# Patient Record
Sex: Male | Born: 1990 | Race: Black or African American | Hispanic: No | Marital: Single | Smoking: Never smoker
Health system: Southern US, Community
[De-identification: ages and names within clinical notes are randomized; demographics above are authoritative.]

## PROBLEM LIST (undated history)

## (undated) DIAGNOSIS — H01009 Unspecified blepharitis unspecified eye, unspecified eyelid: Secondary | ICD-10-CM

---
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
## 2011-07-30 LAB — LIPID PANEL: HDL: 39 mg/dL (ref 35–70)

## 2011-08-29 LAB — LIPID PANEL: LDL Cholesterol: 90 mg/dL

## 2011-09-16 ENCOUNTER — Encounter: Payer: Self-pay | Admitting: Family Medicine

## 2011-09-16 ENCOUNTER — Ambulatory Visit (INDEPENDENT_AMBULATORY_CARE_PROVIDER_SITE_OTHER): Payer: PRIVATE HEALTH INSURANCE | Admitting: Family Medicine

## 2011-09-16 VITALS — BP 133/89 | HR 68 | Temp 98.0°F | Ht 67.5 in | Wt 194.5 lb

## 2011-09-16 DIAGNOSIS — Z23 Encounter for immunization: Secondary | ICD-10-CM

## 2011-09-16 NOTE — Patient Instructions (Signed)
Your main health goal is to do more aerobic exercise - running, jogging, biking - something that gets your breathing and heart rate up for at least 30 minutes 5 days a week  This would lower your blood pressure and your weight and it would raise your HDL cholesterol - all good things for your health  Check your blood pressure regularly it should be < 130/85

## 2011-09-16 NOTE — Progress Notes (Signed)
  Subjective:    Patient ID: Jerry Ortega, male    DOB: Feb 17, 1991, 20 y.o.   MRN: [id number]  HPI  For CPE feels well    Review of Systems Patient reports no  vision/ hearing changes,anorexia, weight change, fever ,adenopathy, persistant / recurrent hoarseness, swallowing issues, chest pain, edema,persistant / recurrent cough, hemoptysis, dyspnea(rest, exertional, paroxysmal nocturnal), gastrointestinal  bleeding (melena, rectal bleeding), abdominal pain, excessive heart burn, GU symptoms(dysuria, hematuria, pyuria, voiding/incontinence  Issues) syncope, focal weakness, severe memory loss, concerning skin lesions, depression, anxiety, abnormal bruising/bleeding, major joint swelling.       Objective:   Physical Exam  Heart - Regular rate and rhythm.  No murmurs, gallops or rubs.    Lungs:  Normal respiratory effort, chest expands symmetrically. Lungs are clear to auscultation, no crackles or wheezes. Extremities:  No cyanosis, edema, or deformity noted with good range of motion of all major joints.   Abdomen: soft and non-tender without masses, organomegaly or hernias noted.  No guarding or rebound Skin:  Intact without suspicious lesions or rashes Mouth - no lesions, mucous membranes are moist, no decaying teeth  Neck:  No deformities, thyromegaly, masses, or tenderness noted.   Supple with full range of motion without pain.       Assessment & Plan:   Normal Physical See AVS for instructions

## 2012-07-14 LAB — LIPID PANEL
HDL: 40 mg/dL (ref 35–70)
LDL Cholesterol: 104 mg/dL

## 2012-08-24 ENCOUNTER — Ambulatory Visit (INDEPENDENT_AMBULATORY_CARE_PROVIDER_SITE_OTHER): Payer: PRIVATE HEALTH INSURANCE | Admitting: Family Medicine

## 2012-08-24 ENCOUNTER — Encounter: Payer: Self-pay | Admitting: Family Medicine

## 2012-08-24 VITALS — BP 120/80 | HR 99 | Temp 98.2°F | Ht 67.0 in | Wt 183.0 lb

## 2012-08-24 DIAGNOSIS — F411 Generalized anxiety disorder: Secondary | ICD-10-CM

## 2012-08-24 DIAGNOSIS — F419 Anxiety disorder, unspecified: Secondary | ICD-10-CM

## 2012-08-24 DIAGNOSIS — Z23 Encounter for immunization: Secondary | ICD-10-CM

## 2012-08-24 MED ORDER — SERTRALINE HCL 25 MG PO TABS: 25.0000 mg | ORAL_TABLET | Freq: Every day | ORAL | Status: DC

## 2012-08-24 NOTE — Progress Notes (Signed)
  Subjective:    Patient ID: Peter Minium, male    DOB: 02-20-1991, 21 y.o.   MRN: [id number]  HPI Feels well except for   Anxiety Has feelings of anxiousness in work and public and social situations.  Also chronic insomnia with erratic sleep patterns.  Reports a history of depression in past with "almost" suicidal ideation but overcame this with will power.  No depression or suicidal ideation now.  Has good appetite and enjoys work but does not seem to do much socially.  PMH - briefly treated for ADHD   FHx - Mom has anxiety well treated with sertraline.      Review of Systems     Objective:   Physical Exam Heart - Regular rate and rhythm.  No murmurs, gallops or rubs.    Lungs:  Normal respiratory effort, chest expands symmetrically. Lungs are clear to auscultation, no crackles or wheezes. Extremities:  No cyanosis, edema, or deformity noted with good range of motion of all major joints.   Neck:  No deformities, thyromegaly, masses, or tenderness noted.   Supple with full range of motion without pain.        Assessment & Plan:

## 2012-08-24 NOTE — Assessment & Plan Note (Signed)
Tentative diagnosis.  Does not seem to have manic symptoms and does have strong family history of anxiety.  He is reluctant to discuss feelings and feels that he can overcome issues through force of will but also would like to feel better.  Feels he can talk with people at work and does not want to see counselor (I mentioned EAP)  Will start low dose sertraline and follow up closely

## 2012-08-24 NOTE — Patient Instructions (Addendum)
Start the sertraline in the AM 1 tablet for 4-5 days then increase to 2 tabs every AM.   If it makes you tired the take it in the PM.  Come back in 2-3 weeks to see how it is working  Call if me if any problems or questions

## 2012-08-30 ENCOUNTER — Encounter: Payer: Self-pay | Admitting: Family Medicine

## 2012-08-30 ENCOUNTER — Telehealth: Payer: Self-pay | Admitting: Family Medicine

## 2012-08-30 NOTE — Telephone Encounter (Deleted)
Please call him and let him know he left his Work Form.  I filled it out and it is available for pick up at the front desk  Thanks  LC

## 2012-08-30 NOTE — Telephone Encounter (Signed)
Error

## 2012-09-12 ENCOUNTER — Ambulatory Visit (INDEPENDENT_AMBULATORY_CARE_PROVIDER_SITE_OTHER): Payer: PRIVATE HEALTH INSURANCE | Admitting: Family Medicine

## 2012-09-12 ENCOUNTER — Encounter: Payer: Self-pay | Admitting: Family Medicine

## 2012-09-12 VITALS — BP 123/73 | HR 82 | Temp 98.6°F | Ht 67.0 in | Wt 183.0 lb

## 2012-09-12 DIAGNOSIS — F419 Anxiety disorder, unspecified: Secondary | ICD-10-CM

## 2012-09-12 DIAGNOSIS — F411 Generalized anxiety disorder: Secondary | ICD-10-CM

## 2012-09-12 MED ORDER — SERTRALINE HCL 25 MG PO TABS: 50.0000 mg | ORAL_TABLET | Freq: Every day | ORAL | Status: DC

## 2012-09-12 MED ORDER — SERTRALINE HCL 50 MG PO TABS: 50.0000 mg | ORAL_TABLET | Freq: Every day | ORAL | Status: DC

## 2012-09-12 NOTE — Assessment & Plan Note (Signed)
Improving.  Discussed the way the medication works and that it will unlikely cure his symptoms.  He would like to take if for some months and then likely go off to see if still needed.

## 2012-09-12 NOTE — Patient Instructions (Addendum)
Finish taking the 2 25mg  tabs then start taking one 50 mg tab  If any problems or side effects contact us  Take if for 2-6 months then you could try going off of it to see if still needed

## 2012-09-12 NOTE — Progress Notes (Signed)
  Subjective:    Patient ID: Jerry Ortega, male    DOB: [birth year], 21 y.o.   MRN: [id number]  HPI  Anxiety Feels like the sertraline is helping him sleep better (less worrying) feel less anxious at work.  Taking 50 mg daily.  No side effects that he has noticed.   No insomnia or extra energy or rashes.  No depressive symptoms     Review of Systems     Objective:   Physical Exam  Alert no acute distress  Psych:  Cognition and judgment appear intact. Alert, communicative  and cooperative with normal attention span and concentration. No apparent delusions, illusions, hallucinations       Assessment & Plan:

## 2013-07-26 ENCOUNTER — Encounter: Payer: Self-pay | Admitting: Family Medicine

## 2013-07-26 ENCOUNTER — Ambulatory Visit (INDEPENDENT_AMBULATORY_CARE_PROVIDER_SITE_OTHER): Payer: PRIVATE HEALTH INSURANCE | Admitting: Family Medicine

## 2013-07-26 VITALS — BP 120/84 | HR 72 | Temp 97.8°F | Ht 67.0 in | Wt 186.0 lb

## 2013-07-26 DIAGNOSIS — F411 Generalized anxiety disorder: Secondary | ICD-10-CM

## 2013-07-26 DIAGNOSIS — K5900 Constipation, unspecified: Secondary | ICD-10-CM

## 2013-07-26 DIAGNOSIS — F419 Anxiety disorder, unspecified: Secondary | ICD-10-CM

## 2013-07-26 NOTE — Progress Notes (Signed)
  Subjective:    Patient ID: Jerry Ortega, male    DOB: 1991-06-22, 22 y.o.   MRN: [id number]  HPI  Anxiety - stopped sertraline 6 mo ago.  Made him feel tired.  Feels ok with out it.   Has mild mood changes - when stressed he will listen to music.  No weight loss or anhedonia.  Agrees to contact me if this changes  Constipation - for last year of so having hard bowel movement that he has to strain.  Occasional small amounts of BRB.  No abdomen pain or weight loss.  Does not eat any greens or veggies other than bananas.     Patient reports no  vision/ hearing changes,anorexia, weight change, fever ,adenopathy, persistant / recurrent hoarseness, swallowing issues, chest pain, edema,persistant / recurrent cough, hemoptysis, dyspnea(rest, exertional, paroxysmal nocturnal),  abdominal pain, excessive heart burn, GU symptoms(dysuria, hematuria, pyuria, voiding/incontinence  Issues) syncope, focal weakness, severe memory loss, concerning skin lesions, abnormal bruising/bleeding, major joint swelling.    PMH - Smoking status noted.      Review of Systems     Objective:   Physical Exam  Alert no acute distress Neck:  No deformities, thyromegaly, masses, or tenderness noted.   Supple with full range of motion without pain. Heart - Regular rate and rhythm.  No murmurs, gallops or rubs.    Lungs:  Normal respiratory effort, chest expands symmetrically. Lungs are clear to auscultation, no crackles or wheezes. Abdomen: soft and non-tender without masses, organomegaly or hernias noted.  No guarding or rebound Extremities:  No cyanosis, edema, or deformity noted with good range of motion of all major joints.         Assessment & Plan:

## 2013-07-26 NOTE — Assessment & Plan Note (Signed)
Seems stable without medicatons.  He agrees to contact me if any worsening

## 2013-07-26 NOTE — Assessment & Plan Note (Signed)
Seems related to low fiber diet.  No signs of systemic disease.  Trial of fiber and recheck

## 2013-07-26 NOTE — Patient Instructions (Signed)
For your stool - eat 2-3 servings of roughage a day - salad, vegetables or fruit with skin on it.   If you can't then take a fiber supplement every day.  Metamucil or Fibercon   If your bowel movement are not regular and you have no bleeding then come back in 3 weeks  Your triglyrides are ok - Make sure you are fasting for 8 hours next time they take blood  If you mood or anxiety worsens then come back to discuss options  Come back in 3 weeks

## 2013-08-09 ENCOUNTER — Encounter: Payer: Self-pay | Admitting: Family Medicine

## 2013-08-09 ENCOUNTER — Ambulatory Visit (INDEPENDENT_AMBULATORY_CARE_PROVIDER_SITE_OTHER): Payer: PRIVATE HEALTH INSURANCE | Admitting: Family Medicine

## 2013-08-09 VITALS — BP 140/88 | HR 94 | Temp 98.4°F | Wt 184.0 lb

## 2013-08-09 DIAGNOSIS — K5900 Constipation, unspecified: Secondary | ICD-10-CM

## 2013-08-09 DIAGNOSIS — Z23 Encounter for immunization: Secondary | ICD-10-CM

## 2013-08-09 NOTE — Progress Notes (Signed)
  Subjective:    Patient ID: Jerry Ortega, male    DOB: 03-Mar-1991, 22 y.o.   MRN: [id number]  HPI  Constipation - feels is improved with regular use of metamucil tabs.  Not eating any more veggies.  No bleeding.  Sometimes stools are dry and sometimes moist.  No abdomen pain. No weight loss.  Review of Symptoms - see HPI  PMH - Smoking status noted.     Review of Systems     Objective:   Physical Exam  No acute distress       Assessment & Plan:

## 2013-08-09 NOTE — Patient Instructions (Signed)
Adjust the amount of metamucil until your bowel movement are regular  Try to eat at least 2 servings of green vegetables or roughage - apples/ potatoe skins  If your bowel movements are to dry then drink 2-3 glasses more of water a day and/or add a stool softner -   If you are losing weight or having blood in your bowel movement you need to come back

## 2013-08-10 NOTE — Assessment & Plan Note (Signed)
Improved with more fiber.  No red flags for inflammatory bowel disease.  Encouraged more veggies.

## 2014-07-30 ENCOUNTER — Ambulatory Visit (INDEPENDENT_AMBULATORY_CARE_PROVIDER_SITE_OTHER): Payer: PRIVATE HEALTH INSURANCE | Admitting: Family Medicine

## 2014-07-30 ENCOUNTER — Encounter: Payer: Self-pay | Admitting: Family Medicine

## 2014-07-30 ENCOUNTER — Ambulatory Visit (INDEPENDENT_AMBULATORY_CARE_PROVIDER_SITE_OTHER): Payer: PRIVATE HEALTH INSURANCE | Admitting: *Deleted

## 2014-07-30 VITALS — BP 122/75 | HR 80 | Temp 98.2°F | Resp 18 | Wt 168.0 lb

## 2014-07-30 DIAGNOSIS — Z0000 Encounter for general adult medical examination without abnormal findings: Secondary | ICD-10-CM

## 2014-07-30 DIAGNOSIS — Z23 Encounter for immunization: Secondary | ICD-10-CM

## 2014-07-30 NOTE — Patient Instructions (Signed)
Good to see you today!  Thanks for coming in.  You are at a good weight.  If you lose more than 5 more lbs you should come back for blood test  You should exercise mostly aerobic 3 x a week 20-30 minutes  Listen yourself.   If you would like to discuss this further come back

## 2014-07-30 NOTE — Progress Notes (Signed)
   Subjective:    Patient ID: Jerry MiniumAndrew A Harbeson, male    DOB: 1990-11-14, 23 y.o.   MRN: [id number]  HPI  For yearly exam mandated by work  He feels he is generally well.   He has lost about 20 lbs that was semi-intentional.  He realized he was overweight. He feels frustrated by lack of promotion at his job and is held back that he has never gotten a Gafferdrivers license.   He has mild difficulty sleeping due to his 12 hour shifts and less than regular schedule when not working.  He hope this will motivate him.  Is not currently interested in medication or counseling to help with these feelings  Intermittent constipation thatt is much better taking fiber and probiotics.  Occasional mild rectal bleeding.  No pain or itching or diarrhea or abdomen pain   Occasional beer and marijauna.    Patient reports no  vision/ hearing changes,anorexia,  fever ,adenopathy, persistant / recurrent hoarseness, swallowing issues, chest pain, edema,persistant / recurrent cough, hemoptysis, dyspnea(rest, exertional, paroxysmal nocturnal), , abdominal pain, excessive heart burn, GU symptoms(dysuria, hematuria, pyuria, voiding/incontinence  Issues) syncope, focal weakness, severe memory loss, concerning skin lesions, depression, anxiety, abnormal bruising/bleeding, major joint swelling.    Review of Systems     Objective:   Physical Exam  Alert no acute distress Psych:  Cognition and judgment appear intact. Alert, communicative  and cooperative with normal attention span and concentration. No apparent delusions, illusions, hallucinations Ears:  External ear exam shows no significant lesions or deformities.  Otoscopic examination reveals clear canals, tympanic membranes are intact bilaterally without bulging, retraction, inflammation or discharge. Hearing is grossly normal bilaterall Neck:  No deformities, thyromegaly, masses, or tenderness noted.   Supple with full range of motion without pain. Heart - Regular rate  and rhythm.  No murmurs, gallops or rubs.    Lungs:  Normal respiratory effort, chest expands symmetrically. Lungs are clear to auscultation, no crackles or wheezes. Abdomen: soft and non-tender without masses, organomegaly or hernias noted.  No guarding or rebound Extremities:  No cyanosis, edema, or deformity noted with good range of motion of all major joints.         Assessment & Plan:    Weight loss - at an appropriate weight.  No red flags for severe depression or endocrine disorder.  Will monitor  Mental Health - might benefit from counseling which I offered.  He will consider.  Not interested in medications.

## 2019-10-10 ENCOUNTER — Ambulatory Visit: Payer: PRIVATE HEALTH INSURANCE | Attending: Internal Medicine

## 2019-10-10 ENCOUNTER — Other Ambulatory Visit: Payer: PRIVATE HEALTH INSURANCE

## 2019-10-10 ENCOUNTER — Other Ambulatory Visit: Payer: Self-pay

## 2019-10-10 DIAGNOSIS — Z20822 Contact with and (suspected) exposure to covid-19: Secondary | ICD-10-CM

## 2019-10-12 LAB — NOVEL CORONAVIRUS, NAA: SARS-CoV-2, NAA: NOT DETECTED

## 2020-05-02 ENCOUNTER — Ambulatory Visit
Admission: EM | Admit: 2020-05-02 | Discharge: 2020-05-02 | Disposition: A | Discharge status: Home / Self Care | Payer: 59 | Attending: Emergency Medicine | Admitting: Emergency Medicine

## 2020-05-02 ENCOUNTER — Other Ambulatory Visit: Payer: Self-pay

## 2020-05-02 NOTE — ED Triage Notes (Signed)
Pt here for suture removal, 4 stiches from left nostril removed , skin approximated well

## 2020-08-14 ENCOUNTER — Ambulatory Visit (INDEPENDENT_AMBULATORY_CARE_PROVIDER_SITE_OTHER): Payer: 59

## 2020-08-14 ENCOUNTER — Ambulatory Visit
Admission: EM | Admit: 2020-08-14 | Discharge: 2020-08-14 | Disposition: A | Discharge status: Home / Self Care | Payer: 59 | Attending: Emergency Medicine | Admitting: Emergency Medicine

## 2020-08-14 ENCOUNTER — Encounter: Payer: Self-pay | Admitting: Emergency Medicine

## 2020-08-14 ENCOUNTER — Other Ambulatory Visit: Payer: Self-pay

## 2020-08-14 DIAGNOSIS — W19XXXA Unspecified fall, initial encounter: Secondary | ICD-10-CM

## 2020-08-14 DIAGNOSIS — S99921A Unspecified injury of right foot, initial encounter: Secondary | ICD-10-CM | POA: Diagnosis not present

## 2020-08-14 DIAGNOSIS — M79671 Pain in right foot: Secondary | ICD-10-CM

## 2020-08-14 MED ORDER — MELOXICAM 15 MG PO TABS: 15.0000 mg | ORAL_TABLET | Freq: Every day | ORAL | 0 refills | Status: DC

## 2020-08-14 NOTE — ED Provider Notes (Signed)
[facility]   [id number] 08/14/20 Arrival Time: 1701  CC: foot PAIN  SUBJECTIVE: History from: patient. Jerry Ortega is a 29 y.o. male complains of RT foot pain and injury that occurred this morning.  Fall from 5 feet when attic stairs broke.  Localizes the pain to the top of foot.  Describes the pain as intermittent and achy in character.  Has NOT tried OTC medications.  Symptoms are made worse with walking.  Denies similar symptoms in the past.  Complains of swelling.  Denies fever, chills, erythema, ecchymosis, weakness, numbness and tingling.  ROS: As per HPI.  All other pertinent ROS negative.     History reviewed. No pertinent past medical history. History reviewed. No pertinent surgical history. No Known Allergies No current facility-administered medications on file prior to encounter.   No current outpatient medications on file prior to encounter.   Social History   Socioeconomic History  . Marital status: Single    Spouse name: Not on file  . Number of children: Not on file  . Years of education: Not on file  . Highest education level: Not on file  Occupational History  . Not on file  Tobacco Use  . Smoking status: Never Smoker  . Smokeless tobacco: Never Used  Substance and Sexual Activity  . Alcohol use: Yes    Alcohol/week: 1.0 standard drink    Types: 1 drink(s) per week  . Drug use: No  . Sexual activity: Not on file  Other Topics Concern  . 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 Difficulty of Paying Living Expenses: Not on file  Food Insecurity:   . Worried About Programme researcher, broadcasting/film/video in the Last Year: Not on file  . Ran Out of Food in the Last Year: Not on file  Transportation Needs:   . Lack of Transportation (Medical): Not on file  . Lack  of Transportation (Non-Medical): Not on file  Physical Activity:   . Days of Exercise per Week: Not on file  . Minutes of Exercise per Session: Not on file  Stress:   . Feeling of Stress : Not on file  Social Connections:   . Frequency of Communication with Friends and Family: Not on file  . Frequency of Social Gatherings with Friends and Family: Not on file  . Attends Religious Services: Not on file  . Active Member of Clubs or Organizations: Not on file  . Attends Banker Meetings: Not on file  . Marital Status: Not on file  Intimate Partner Violence:   . Fear of Current or Ex-Partner: Not on file  . Emotionally Abused: Not on file  . Physically Abused: Not on file  . Sexually Abused: Not on file   Family History  Problem Relation Age of Onset  . Hypertension Father   . Diabetes Paternal Grandmother     OBJECTIVE:  Vitals:   08/14/20 1713 08/14/20 1714  BP:  123/73  Pulse:  84  Resp:  18  Temp:  98.7 F (37.1 C)  TempSrc:  Oral  SpO2:  96%  Weight: 175 lb (79.4 kg)   Height: 5\' 7"  (1.702 m)     General appearance: ALERT; in no acute distress.  Head: NCAT Lungs: Normal respiratory effort CV: Dorsalis pedis pulse  2+ Musculoskeletal: RT foot Inspection: Mild swelling over lateral  Palpation: diffusely TTP over 2-5 distal MTs Strength: deferred Skin: warm and dry Neurologic: Ambulates with minimal difficulty Psychological: alert and cooperative; normal mood and affect  DIAGNOSTIC STUDIES:  DG Foot Complete Right  Result Date: 08/14/2020 CLINICAL DATA:  Fall, right foot pain EXAM: RIGHT FOOT COMPLETE - 3+ VIEW COMPARISON:  None. FINDINGS: There is no evidence of fracture or dislocation. There is no evidence of arthropathy or other focal bone abnormality. Soft tissues are unremarkable. IMPRESSION: Negative. Electronically Signed   By: Charlett Nose M.D.   On: 08/14/2020 17:47    X-rays negative for bony abnormalities including fracture, or  dislocation.  No soft tissue swelling.    I have reviewed the x-rays myself and the radiologist interpretation. I am in agreement with the radiologist interpretation.     ASSESSMENT & PLAN:  1. Right foot pain   2. Injury of right foot, initial encounter     @NIMG @  Meds ordered this encounter  Medications  . meloxicam (MOBIC) 15 MG tablet    Sig: Take 1 tablet (15 mg total) by mouth daily.    Dispense:  30 tablet    Refill:  0    Order Specific Question:   Supervising Provider    Answer:   Eustace Moore   X-ray negative for fracture or dislocation.  Continue conservative management of rest, ice, and elevation Take mobic as needed for pain relief (may cause abdominal discomfort, ulcers, and GI bleeds avoid taking with other NSAIDs) Follow up with PCP if symptoms persist Return or go to the ER if you have any new or worsening symptoms (fever, chills, chest pain, redness, swelling, bruising, deformity, etc...)   Reviewed expectations re: course of current medical issues. Questions answered. Outlined signs and symptoms indicating need for more acute intervention. Patient verbalized understanding. After Visit Summary given.    [[id number]], PA-C 08/14/20 1752

## 2020-08-14 NOTE — ED Triage Notes (Signed)
Pt fell 5 feet when attic stairs broke today around 1000. C/o pain to top of RT foot.  Pt ambulatory with no difficulty to room from waiting room.

## 2020-08-14 NOTE — Discharge Instructions (Signed)
X-ray negative for fracture or dislocation.  Continue conservative management of rest, ice, and elevation Take mobic as needed for pain relief (may cause abdominal discomfort, ulcers, and GI bleeds avoid taking with other NSAIDs) Follow up with PCP if symptoms persist Return or go to the ER if you have any new or worsening symptoms (fever, chills, chest pain, redness, swelling, bruising, deformity, etc...)

## 2021-04-08 IMAGING — DX DG FOOT COMPLETE 3+V*R*
3 series · 3 of 3 positions shown · non-contrast
Comparison: None.

CLINICAL DATA: Fall, right foot pain

EXAM:
RIGHT FOOT COMPLETE - 3+ VIEW

[foot ap]
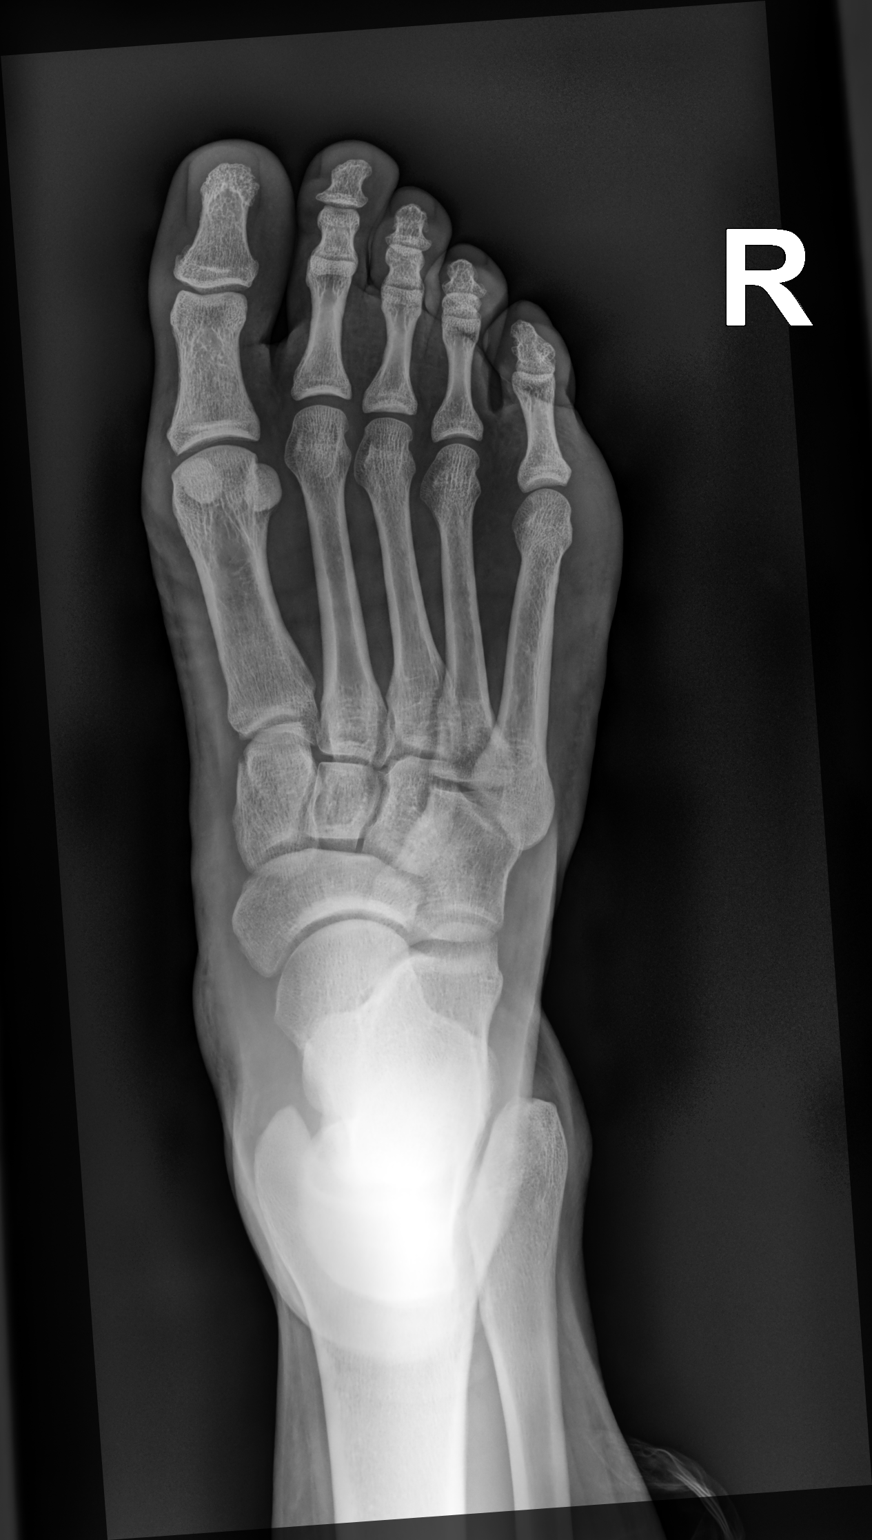

[foot mlo]
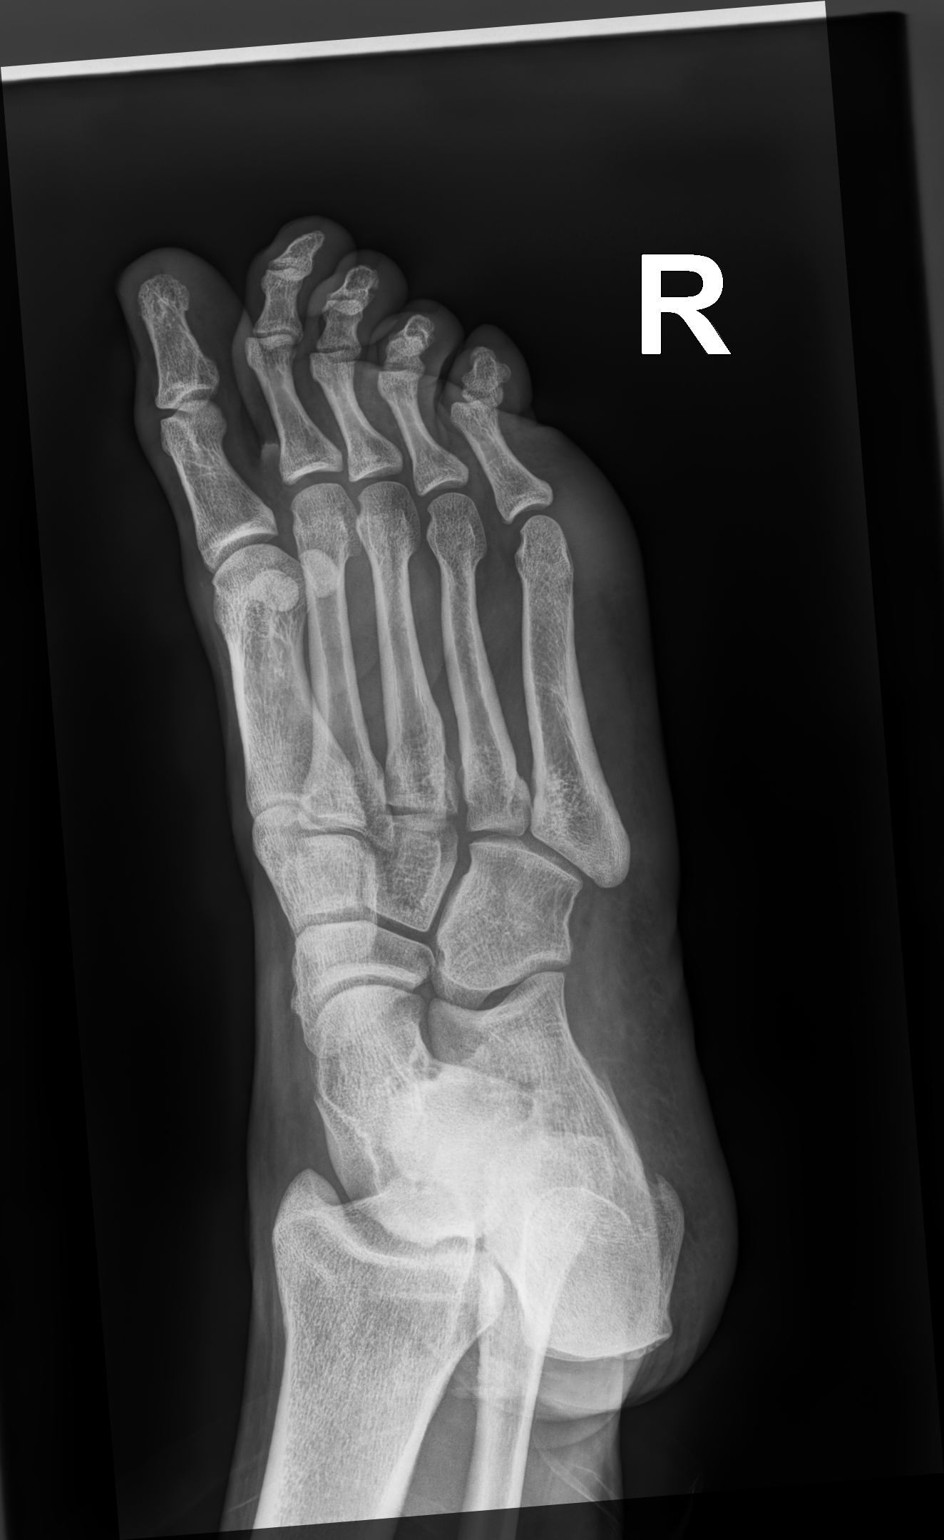

[foot lat]
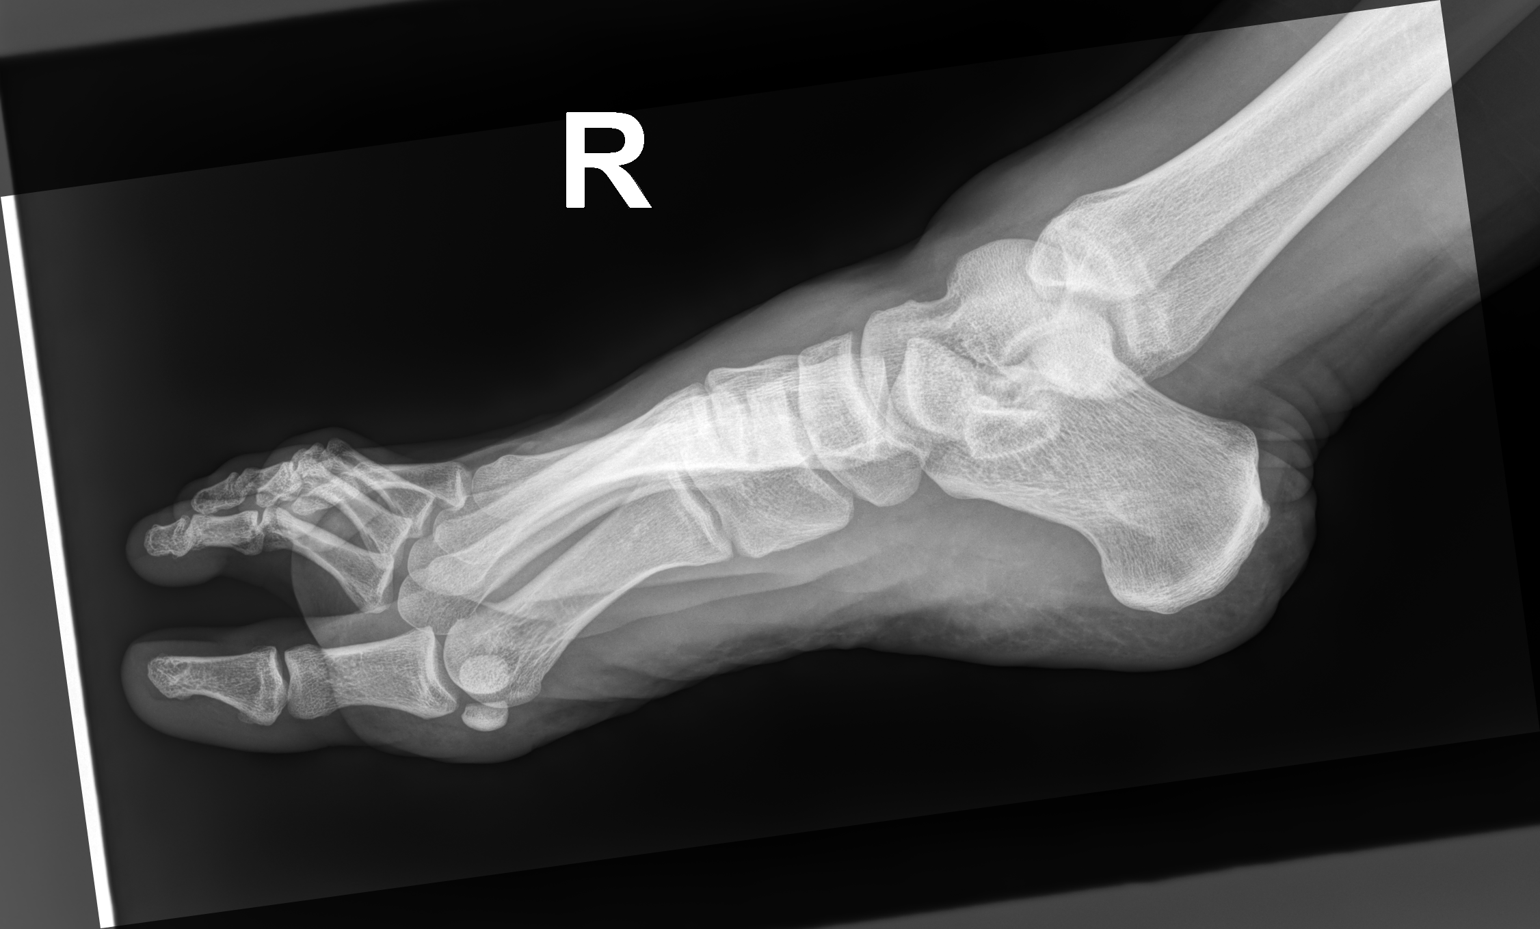

[3 of 3 positions shown; findings below may reference images not displayed]

FINDINGS: There is no evidence of fracture or dislocation. There is no
evidence of arthropathy or other focal bone abnormality. Soft
tissues are unremarkable.
IMPRESSION: Negative.

## 2022-12-16 ENCOUNTER — Encounter: Payer: Self-pay | Admitting: Emergency Medicine

## 2022-12-16 ENCOUNTER — Ambulatory Visit: Payer: Self-pay

## 2022-12-16 ENCOUNTER — Ambulatory Visit
Admission: EM | Admit: 2022-12-16 | Discharge: 2022-12-16 | Disposition: A | Discharge status: Home / Self Care | Payer: Commercial Managed Care - PPO

## 2022-12-16 ENCOUNTER — Other Ambulatory Visit: Payer: Self-pay

## 2022-12-16 DIAGNOSIS — J029 Acute pharyngitis, unspecified: Secondary | ICD-10-CM

## 2022-12-16 DIAGNOSIS — Z20818 Contact with and (suspected) exposure to other bacterial communicable diseases: Secondary | ICD-10-CM

## 2022-12-16 HISTORY — DX: Unspecified blepharitis unspecified eye, unspecified eyelid: H01.009

## 2022-12-16 LAB — POCT RAPID STREP A (OFFICE): Rapid Strep A Screen: NEGATIVE

## 2022-12-16 NOTE — ED Provider Notes (Signed)
[facility]    CSN: [id number] Arrival date & time: 12/16/22  1653      History   Chief Complaint Chief Complaint  Patient presents with   Sore Throat    HPI Jerry Ortega is a 32 y.o. male.   Patient presents for approximately 3 weeks of slightly sore throat, slightly runny and stuffy nose, diarrhea Sunday evening, and decreased appetite with fatigue.  No fever, bodyaches or chills, congested cough, dry cough, shortness of breath or chest pain, postnasal drainage, headache, ear pain, abdominal pain, nausea/vomiting, or decreased appetite.  Has not take anything for symptoms so far.  Reports his wife tested positive for strep throat today.  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    Past Medical History:  Diagnosis Date   Blepharitis     Patient Active Problem List   Diagnosis Date Noted   Unspecified constipation 07/26/2013   Anxiety 08/24/2012    History reviewed. No pertinent surgical history.     Home Medications    Prior to Admission medications   Medication Sig Start Date End Date Taking? Authorizing Provider  doxycycline (ADOXA) 100 MG tablet Take 100 mg by mouth daily.   Yes [provider]  meloxicam (MOBIC) 15 MG tablet Take 1 tablet (15 mg total) by mouth daily. 08/14/20   Lestine Box, PA-C    Family History Family History  Problem Relation Age of Onset   Hypertension Father    Diabetes Paternal Grandmother     Social History Social History   Tobacco Use   Smoking status: Never   Smokeless tobacco: Never  Substance Use Topics   Alcohol use: Not Currently    Alcohol/week: 1.0 standard drink of alcohol    Types: 1 drink(s) per week   Drug use: No     Allergies   Patient has no known allergies.   Review of Systems Review of Systems Per HPI  Physical Exam Triage Vital Signs ED Triage Vitals  Enc Vitals Group     BP 12/16/22 1715 135/82     Pulse Rate 12/16/22 1715 78      Resp 12/16/22 1715 20     Temp 12/16/22 1715 98.2 F (36.8 C)     Temp Source 12/16/22 1715 Oral     SpO2 12/16/22 1715 97 %     Weight --      Height --      Head Circumference --      Peak Flow --      Pain Score 12/16/22 1711 2     Pain Loc --      Pain Edu? --      Excl. in [place]? --    No data found.  Updated Vital Signs BP 135/82 (BP Location: Right Arm)   Pulse 78   Temp 98.2 F (36.8 C) (Oral)   Resp 20   SpO2 97%   Visual Acuity Right Eye Distance:   Left Eye Distance:   Bilateral Distance:    Right Eye Near:   Left Eye Near:    Bilateral Near:     Physical Exam Vitals and nursing note reviewed.  Constitutional:      General: He is not in acute distress.    Appearance: He is well-developed. He is not ill-appearing, toxic-appearing or diaphoretic.  HENT:     Head: Normocephalic and atraumatic.     Right Ear: Tympanic membrane and ear canal normal. No drainage, swelling or tenderness. No  middle ear effusion. Tympanic membrane is not erythematous.     Left Ear: Tympanic membrane and ear canal normal. No drainage, swelling or tenderness.  No middle ear effusion. Tympanic membrane is not erythematous.     Nose: No congestion or rhinorrhea.     Mouth/Throat:     Mouth: Mucous membranes are dry.     Pharynx: Posterior oropharyngeal erythema present. No uvula swelling.     Tonsils: No tonsillar exudate or tonsillar abscesses. 0 on the right. 0 on the left.  Eyes:     Conjunctiva/sclera: Conjunctivae normal.  Cardiovascular:     Rate and Rhythm: Normal rate and regular rhythm.  Pulmonary:     Effort: Pulmonary effort is normal. No respiratory distress.     Breath sounds: Normal breath sounds. No wheezing, rhonchi or rales.  Musculoskeletal:     Cervical back: Normal range of motion and neck supple.  Lymphadenopathy:     Cervical: No cervical adenopathy.  Skin:    General: Skin is warm and dry.     Coloration: Skin is not pale.     Findings: No erythema or  rash.  Neurological:     Mental Status: He is alert and oriented to person, place, and time.      UC Treatments / Results  Labs (all labs ordered are listed, but only abnormal results are displayed) Labs Reviewed  POCT RAPID STREP A (OFFICE)    EKG   Radiology No results found.  Procedures Procedures (including critical care time)  Medications Ordered in UC Medications - No data to display  Initial Impression / Assessment and Plan / UC Course  I have reviewed the triage vital signs and the nursing notes.  Pertinent labs & imaging results that were available during my care of the patient were reviewed by me and considered in my medical decision making (see chart for details).   Patient is well-appearing, normotensive, afebrile, not tachycardic, not tachypneic, oxygenating well on room air.   1. Acute pharyngitis, unspecified etiology 2. Exposure to strep throat Rapid strep test is negative Low suspicion for strep throat given examination, history, and vital signs Suspect allergic rhinitis, recommended starting oral antihistamine and nasal spray ER and return precautions discussed with patient Note given for  The patient was given the opportunity to ask questions.  All questions answered to their satisfaction.  The patient is in agreement to this plan.    Final Clinical Impressions(s) / UC Diagnoses   Final diagnoses:  Acute pharyngitis, unspecified etiology  Exposure to strep throat     Discharge Instructions      The rapid strep throat test today is negative Recommend starting oral antihistamine and Flonase nasal spray      ED Prescriptions   None    PDMP not reviewed this encounter.   Eulogio Bear, NP 12/16/22 [phone]

## 2022-12-16 NOTE — ED Triage Notes (Signed)
Pt reports was exposed to strep and reports sore throat for last several weeks. Pt reports chronically takes doxycycline for eyes but reports would like to be checked for strep.

## 2022-12-16 NOTE — Discharge Instructions (Addendum)
The rapid strep throat test today is negative Recommend starting oral antihistamine and Flonase nasal spray

## 2024-05-05 ENCOUNTER — Ambulatory Visit
Admission: EM | Admit: 2024-05-05 | Discharge: 2024-05-05 | Disposition: A | Discharge status: Home / Self Care | Attending: Family Medicine | Admitting: Family Medicine

## 2024-05-05 ENCOUNTER — Encounter: Payer: Self-pay | Admitting: Emergency Medicine

## 2024-05-05 DIAGNOSIS — H1033 Unspecified acute conjunctivitis, bilateral: Secondary | ICD-10-CM

## 2024-05-05 MED ORDER — POLYMYXIN B-TRIMETHOPRIM 10000-0.1 UNIT/ML-% OP SOLN: 1.0000 [drp] | Freq: Four times a day (QID) | OPHTHALMIC | 0 refills | Status: AC

## 2024-05-05 MED ORDER — OLOPATADINE HCL 0.1 % OP SOLN: 1.0000 [drp] | Freq: Two times a day (BID) | OPHTHALMIC | 2 refills | Status: AC | PRN

## 2024-05-05 NOTE — Discharge Instructions (Signed)
 I have prescribed both an antibiotic drop and an allergy drop to help with your symptoms.  You may use the allergy drop ongoing as needed when exposed to allergy triggers.  Cool compresses, good handwashing as well recommended

## 2024-05-05 NOTE — ED Provider Notes (Signed)
 [facility]    CSN: [id number] Arrival date & time: 05/05/24  1118      History   Chief Complaint No chief complaint on file.   HPI Jerry Ortega is a 33 y.o. male.   Presenting today with several day history of bilateral eye redness, drainage, itching, irritation.  Denies visual change, injury to the eye, headache, nausea, vomiting, fevers.  Trying Systane drops with no relief.  Significant other recently diagnosed with pinkeye.  He also was doing a work call at [employer] and states he is highly allergic To cats and is wondering if this has anything to do with his symptoms.    Past Medical History:  Diagnosis Date   Blepharitis     Patient Active Problem List   Diagnosis Date Noted   Constipation 07/26/2013   Anxiety 08/24/2012    History reviewed. No pertinent surgical history.     Home Medications    Prior to Admission medications   Medication Sig Start Date End Date Taking? Authorizing Provider  olopatadine  (PATADAY ) 0.1 % ophthalmic solution Place 1 drop into both eyes 2 (two) times daily as needed for allergies. 05/05/24  Yes Stuart Vernell Norris, PA-C  trimethoprim -polymyxin b  (POLYTRIM ) ophthalmic solution Place 1 drop into both eyes every 6 (six) hours. 05/05/24  Yes Stuart Vernell Norris, PA-C    Family History Family History  Problem Relation Age of Onset   Hypertension Father    Diabetes Paternal Grandmother     Social History Social History   Tobacco Use   Smoking status: Never   Smokeless tobacco: Never  Substance Use Topics   Alcohol use: Not Currently    Alcohol/week: 1.0 standard drink of alcohol    Types: 1 drink(s) per week   Drug use: No     Allergies   Patient has no known allergies.   Review of Systems Review of Systems Per HPI  Physical Exam Triage Vital Signs ED Triage Vitals  Encounter Vitals Group     BP 05/05/24 1121 127/81     Girls Systolic BP Percentile --      Girls Diastolic BP Percentile  --      Boys Systolic BP Percentile --      Boys Diastolic BP Percentile --      Pulse Rate 05/05/24 1121 84     Resp 05/05/24 1121 18     Temp 05/05/24 1121 98.8 F (37.1 C)     Temp Source 05/05/24 1121 Oral     SpO2 05/05/24 1121 97 %     Weight --      Height --      Head Circumference --      Peak Flow --      Pain Score 05/05/24 1123 0     Pain Loc --      Pain Education --      Exclude from Growth Chart --    No data found.  Updated Vital Signs BP 127/81 (BP Location: Right Arm)   Pulse 84   Temp 98.8 F (37.1 C) (Oral)   Resp 18   SpO2 97%   Visual Acuity Right Eye Distance:   Left Eye Distance:   Bilateral Distance:    Right Eye Near:   Left Eye Near:    Bilateral Near:     Physical Exam Vitals and nursing note reviewed.  Constitutional:      Appearance: Normal appearance.  HENT:     Head: Atraumatic.  Eyes:  Extraocular Movements: Extraocular movements intact.     Pupils: Pupils are equal, round, and reactive to light.     Comments: Bilateral conjunctival erythema, injection, thick drainage  Cardiovascular:     Rate and Rhythm: Normal rate.  Pulmonary:     Effort: Pulmonary effort is normal.  Musculoskeletal:        General: Normal range of motion.     Cervical back: Normal range of motion and neck supple.  Skin:    General: Skin is warm and dry.  Neurological:     Mental Status: He is oriented to person, place, and time.  Psychiatric:        Mood and Affect: Mood normal.        Thought Content: Thought content normal.        Judgment: Judgment normal.      UC Treatments / Results  Labs (all labs ordered are listed, but only abnormal results are displayed) Labs Reviewed - No data to display  EKG   Radiology No results found.  Procedures Procedures (including critical care time)  Medications Ordered in UC Medications - No data to display  Initial Impression / Assessment and Plan / UC Course  I have reviewed the triage  vital signs and the nursing notes.  Pertinent labs & imaging results that were available during my care of the patient were reviewed by me and considered in my medical decision making (see chart for details).     Will treat for possible bacterial pinkeye with Polytrim  drops, cool compresses, good handwashing.  Will also prescribe Pataday  drops in case any allergic component given his recent exposure to cats which he is known to be allergic to.  May also use these ongoing for new exposures.  Final Clinical Impressions(s) / UC Diagnoses   Final diagnoses:  Acute bacterial conjunctivitis of both eyes     Discharge Instructions      I have prescribed both an antibiotic drop and an allergy drop to help with your symptoms.  You may use the allergy drop ongoing as needed when exposed to allergy triggers.  Cool compresses, good handwashing as well recommended    ED Prescriptions     Medication Sig Dispense Auth. Provider   trimethoprim -polymyxin b  (POLYTRIM ) ophthalmic solution Place 1 drop into both eyes every 6 (six) hours. 10 mL Stuart Vernell Norris, PA-C   olopatadine  (PATADAY ) 0.1 % ophthalmic solution Place 1 drop into both eyes 2 (two) times daily as needed for allergies. 5 mL Stuart Vernell Norris, [place]      PDMP not reviewed this encounter.   Stuart Vernell Norris, [place] 05/05/24 1233

## 2024-05-05 NOTE — ED Triage Notes (Signed)
 Bilateral eye redness since yesterday.  States girlfriend was diagnosed with pinky eye on Sunday.  Has tried using systain eye drops without relief.  Denies any vision changes.
